# Patient Record
Sex: Female | Born: 1975 | Race: Black or African American | Hispanic: No | Marital: Single
Health system: Southern US, Community
[De-identification: ages and names within clinical notes are randomized; demographics above are authoritative.]

---

## 2020-03-16 ENCOUNTER — Emergency Department (HOSPITAL_COMMUNITY): Payer: Medicaid Other

## 2020-03-16 ENCOUNTER — Encounter (HOSPITAL_COMMUNITY): Payer: Self-pay | Admitting: Emergency Medicine

## 2020-03-16 ENCOUNTER — Emergency Department (HOSPITAL_COMMUNITY)
Admission: EM | Admit: 2020-03-16 | Discharge: 2020-03-16 | Disposition: A | Discharge status: Home / Self Care | Payer: Medicaid Other | Attending: Emergency Medicine | Admitting: Emergency Medicine

## 2020-03-16 ENCOUNTER — Other Ambulatory Visit: Payer: Self-pay

## 2020-03-16 DIAGNOSIS — R52 Pain, unspecified: Secondary | ICD-10-CM

## 2020-03-16 DIAGNOSIS — E119 Type 2 diabetes mellitus without complications: Secondary | ICD-10-CM | POA: Diagnosis not present

## 2020-03-16 DIAGNOSIS — W109XXA Fall (on) (from) unspecified stairs and steps, initial encounter: Secondary | ICD-10-CM | POA: Insufficient documentation

## 2020-03-16 DIAGNOSIS — S93421A Sprain of deltoid ligament of right ankle, initial encounter: Secondary | ICD-10-CM | POA: Insufficient documentation

## 2020-03-16 DIAGNOSIS — Y929 Unspecified place or not applicable: Secondary | ICD-10-CM | POA: Insufficient documentation

## 2020-03-16 DIAGNOSIS — Y939 Activity, unspecified: Secondary | ICD-10-CM | POA: Insufficient documentation

## 2020-03-16 DIAGNOSIS — Y999 Unspecified external cause status: Secondary | ICD-10-CM | POA: Insufficient documentation

## 2020-03-16 DIAGNOSIS — S99911A Unspecified injury of right ankle, initial encounter: Secondary | ICD-10-CM | POA: Diagnosis present

## 2020-03-16 NOTE — Progress Notes (Signed)
Orthopedic Tech Progress Note Patient Details:  Cristina Reyes 01/17/1976 [id number]  Ortho Devices Type of Ortho Device: Crutches, Post (short leg) splint Ortho Device/Splint Location: rle Ortho Device/Splint Interventions: Ordered, Application, Adjustment   Post Interventions Patient Tolerated: Well Instructions Provided: Care of device, Adjustment of device   Trinna Post 03/16/2020, 10:24 PM

## 2020-03-16 NOTE — ED Provider Notes (Addendum)
[facility] EMERGENCY DEPARTMENT Provider Note   CSN: [id number] Arrival date & time: 03/16/20  1941     History Chief Complaint  Patient presents with  . Ankle Injury    Momoka Stringfield is a 44 y.o. female.  The history is provided by the patient.  Ankle Injury   Anyi Fels is a 44 y.o. female who presents to the Emergency Department complaining of ankle injury. She presents the emergency department for evaluation of right ankle injury after she missed her step and fell down to stairs. She feels like she rolled her ankle. She does have a remote history of injury to that ankle but did not require surgery. She has pain with weight-bearing and range of motion. She has a history of type II diabetes. Symptoms are moderate and constant nature.    History reviewed. No pertinent past medical history.  There are no problems to display for this patient.   History reviewed. No pertinent surgical history.   OB History   No obstetric history on file.     No family history on file.  Social History   Tobacco Use  . Smoking status: Not on file  Substance Use Topics  . Alcohol use: Not on file  . Drug use: Not on file    Home Medications Prior to Admission medications   Not on File    Allergies    Patient has no known allergies.  Review of Systems   Review of Systems  All other systems reviewed and are negative.   Physical Exam Updated Vital Signs BP (!) 130/107 (BP Location: Right Arm)   Pulse 96   Temp 98.7 F (37.1 C) (Oral)   Resp 18   Ht 5\' 9"  (1.753 m)   Wt 87.1 kg   SpO2 96%   BMI 28.35 kg/m   Physical Exam Vitals and nursing note reviewed.  Constitutional:      Appearance: She is well-developed.  HENT:     Head: Normocephalic and atraumatic.  Cardiovascular:     Rate and Rhythm: Normal rate and regular rhythm.  Pulmonary:     Effort: Pulmonary effort is normal. No respiratory distress.  Musculoskeletal:      Comments: 2+ DP pulses bilaterally. There is moderate soft tissue swelling and tenderness to palpation over the right lateral ankle. There is decreased range of motion in the right ankle secondary to pain. There is no tenderness or swelling over the right knee.  Skin:    General: Skin is warm and dry.  Neurological:     Mental Status: She is alert and oriented to person, place, and time.  Psychiatric:        Behavior: Behavior normal.     ED Results / Procedures / Treatments   Labs (all labs ordered are listed, but only abnormal results are displayed) Labs Reviewed - No data to display  EKG None  Radiology DG Ankle Complete Right  Result Date: 03/16/2020 CLINICAL DATA:  Pain EXAM: RIGHT ANKLE - COMPLETE 3+ VIEW COMPARISON:  None. FINDINGS: There is extensive soft tissue swelling about the lateral malleolus. There is no definite acute displaced fracture or dislocation. There are well corticated osseous fragments adjacent to the distal lateral malleolus which are likely related to an old remote injury. There are no significant degenerative changes of the ankle mortise. An ankle joint effusion is present. IMPRESSION: 1. Extensive soft tissue swelling about the lateral malleolus without evidence for an acute displaced fracture or dislocation.  2. Ankle joint effusion. 3. Probable old remote injury to the lateral malleolus. Electronically Signed   By: Katherine Mantle M.D.   On: 03/16/2020 20:43    Procedures Procedures (including critical care time) SPLINT APPLICATION Date/Time: 10:23 PM Authorized by: Tilden Fossa Consent: Verbal consent obtained. Risks and benefits: risks, benefits and alternatives were discussed Consent given by: patient Splint applied by: orthopedic technician Location details: RLE Splint type: posterior Supplies used: orthoglass, ace wrap Post-procedure: The splinted body part was neurovascularly unchanged following the procedure. Patient tolerance: Patient  tolerated the procedure well with no immediate complications.   Medications Ordered in ED Medications - No data to display  ED Course  I have reviewed the triage vital signs and the nursing notes.  Pertinent labs & imaging results that were available during my care of the patient were reviewed by me and considered in my medical decision making (see chart for details).    MDM Rules/Calculators/A&P                         patient here for evaluation of right ankle pain after twisting it prior to ED arrival. Imaging is negative for acute fracture, personally reviewed. There is a ankle effusion. Given effusion, concern for ligamentous injury will place and splint. Discussed nonweightbearing status with orthopedics follow-up. Discussed ibuprofen and Tylenol available over-the-counter according to label instructions as needed for pain.  Final Clinical Impression(s) / ED Diagnoses Final diagnoses:  Sprain of deltoid ligament of right ankle, initial encounter    Rx / DC Orders ED Discharge Orders    None       Tilden Fossa, MD 03/16/20 [id number]    Tilden Fossa, MD 03/16/20 2223

## 2020-03-16 NOTE — ED Triage Notes (Signed)
POV, pt tripped down step tonight, right ankle is swollen and very painful.  Unable to put weight on it.

## 2021-10-16 IMAGING — CR DG ANKLE COMPLETE 3+V*R*
3 series · 3 of 3 positions shown · non-contrast
Comparison: None.

CLINICAL DATA: Pain

EXAM:
RIGHT ANKLE - COMPLETE 3+ VIEW

[ankle ap]
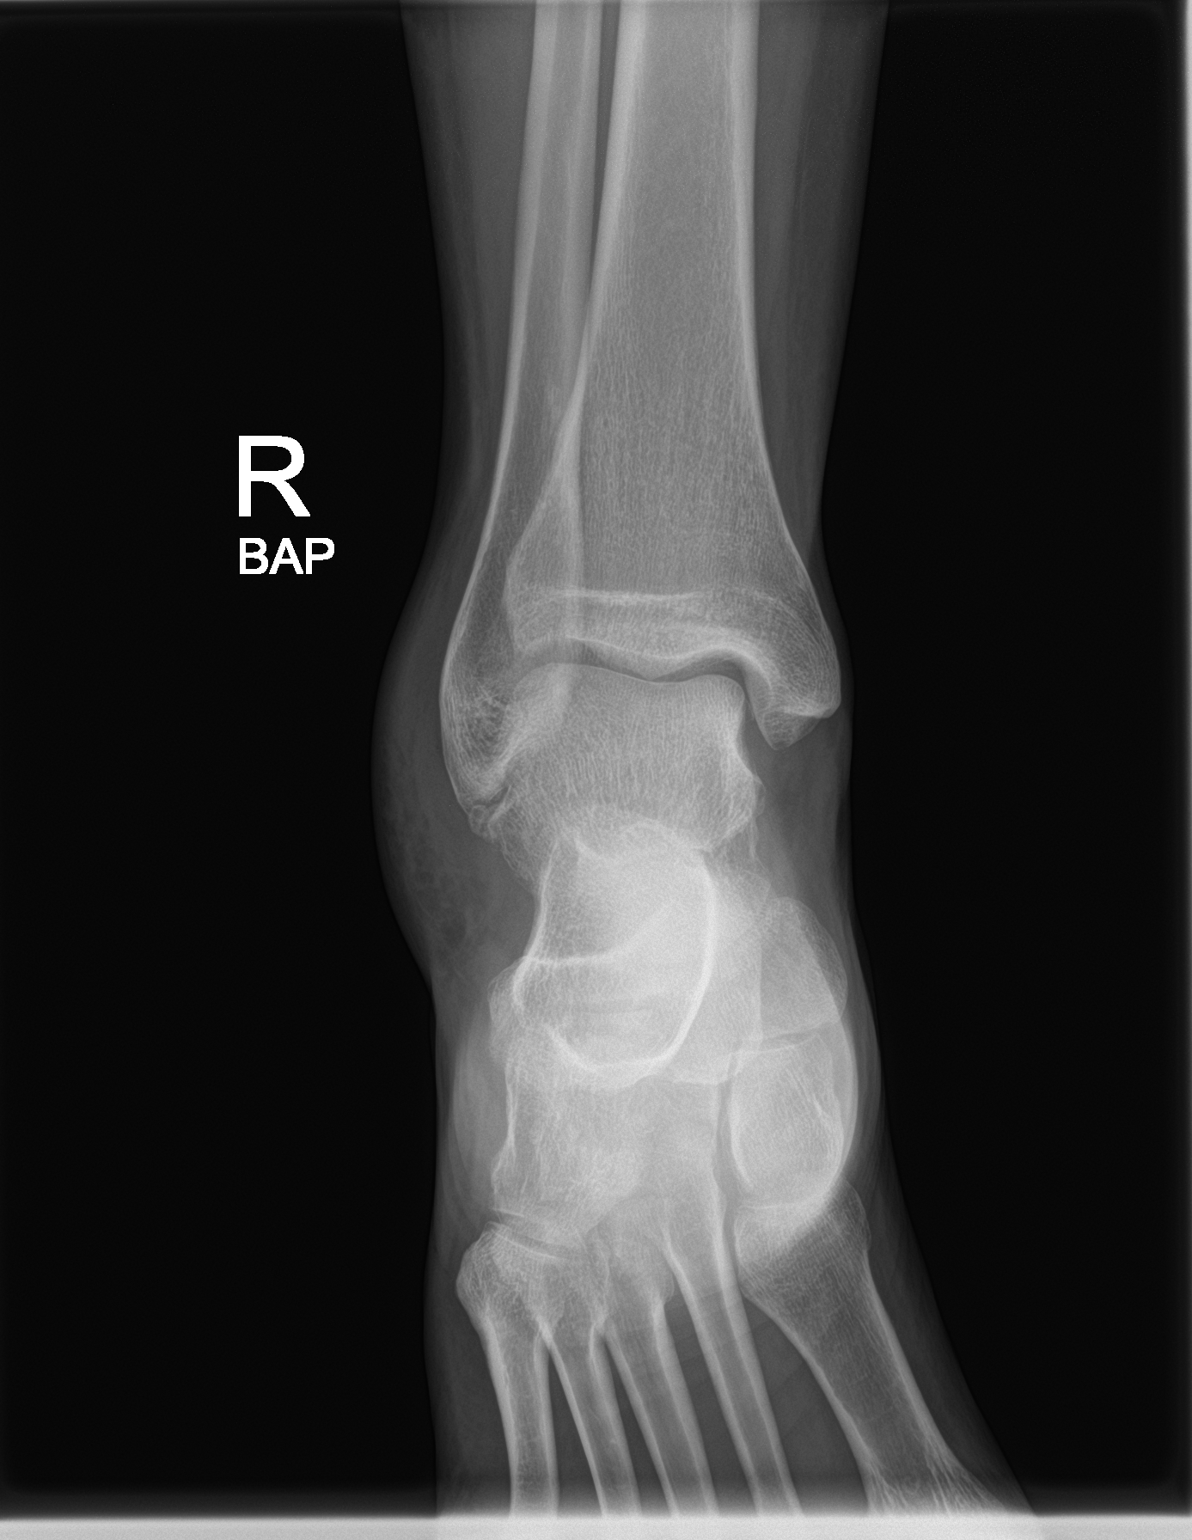

[ankle obl]
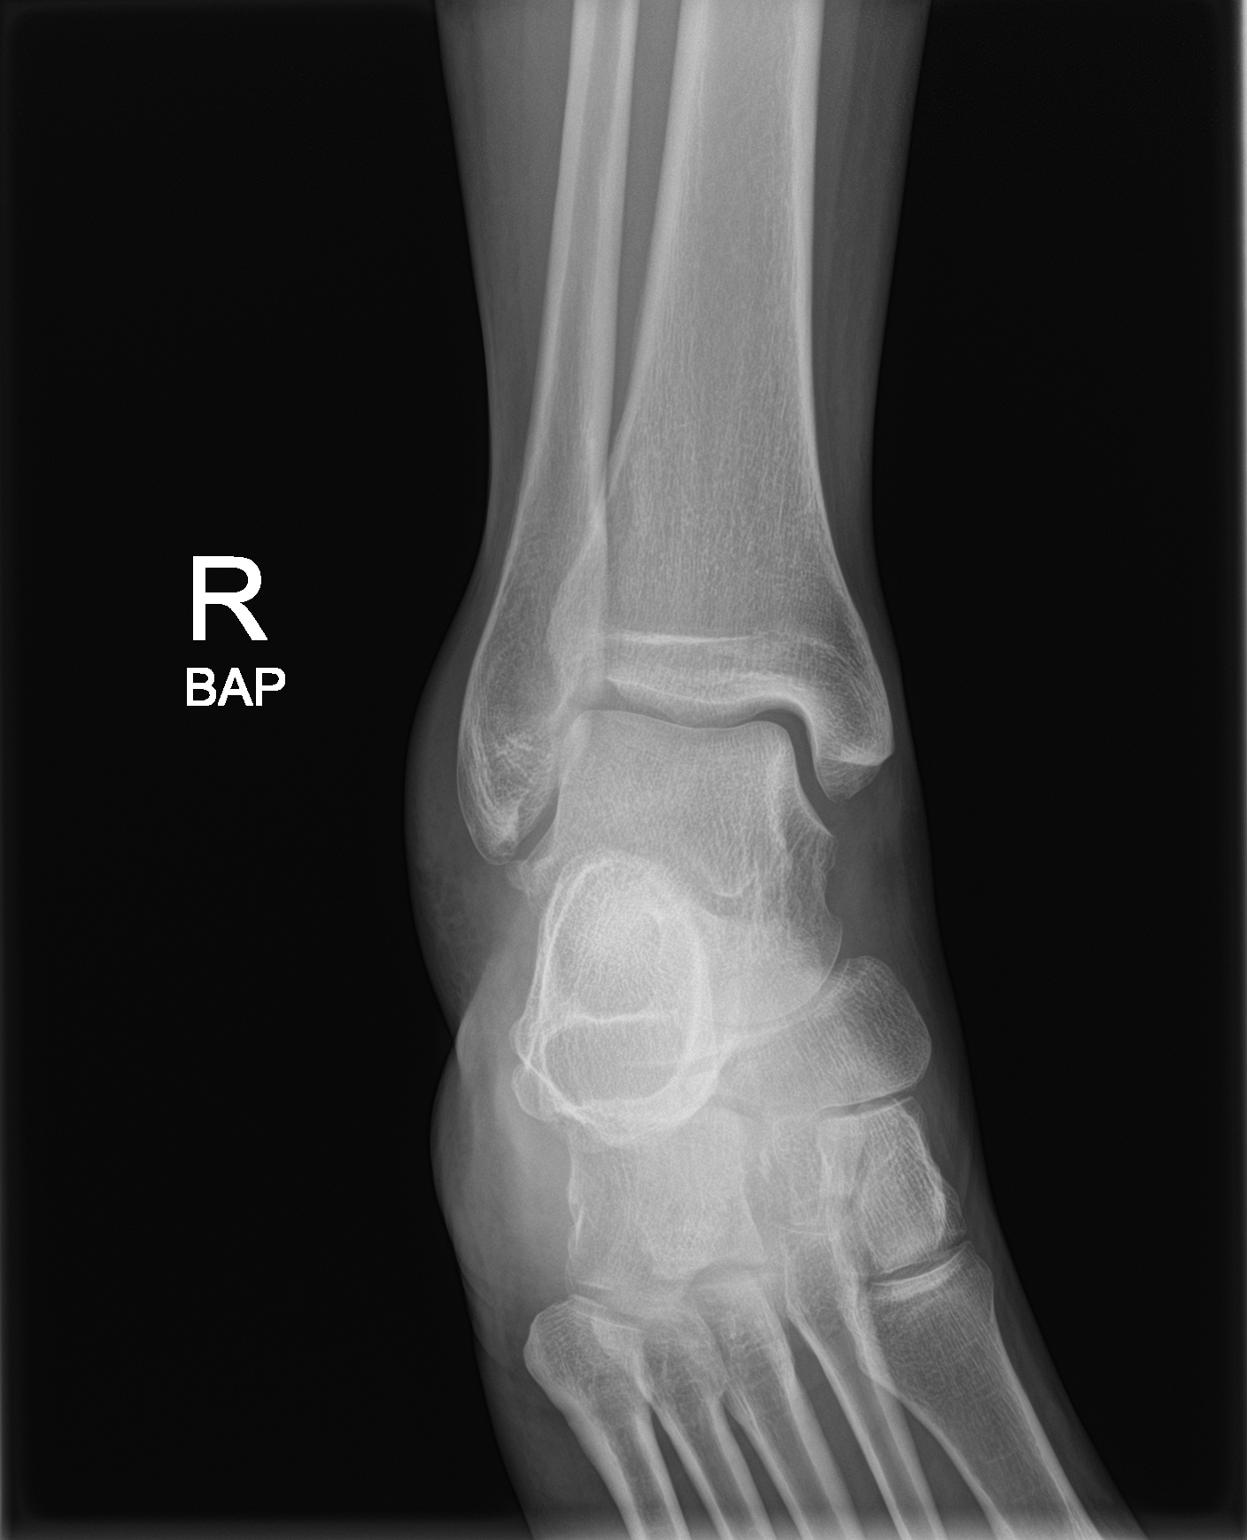

[ankle lat]
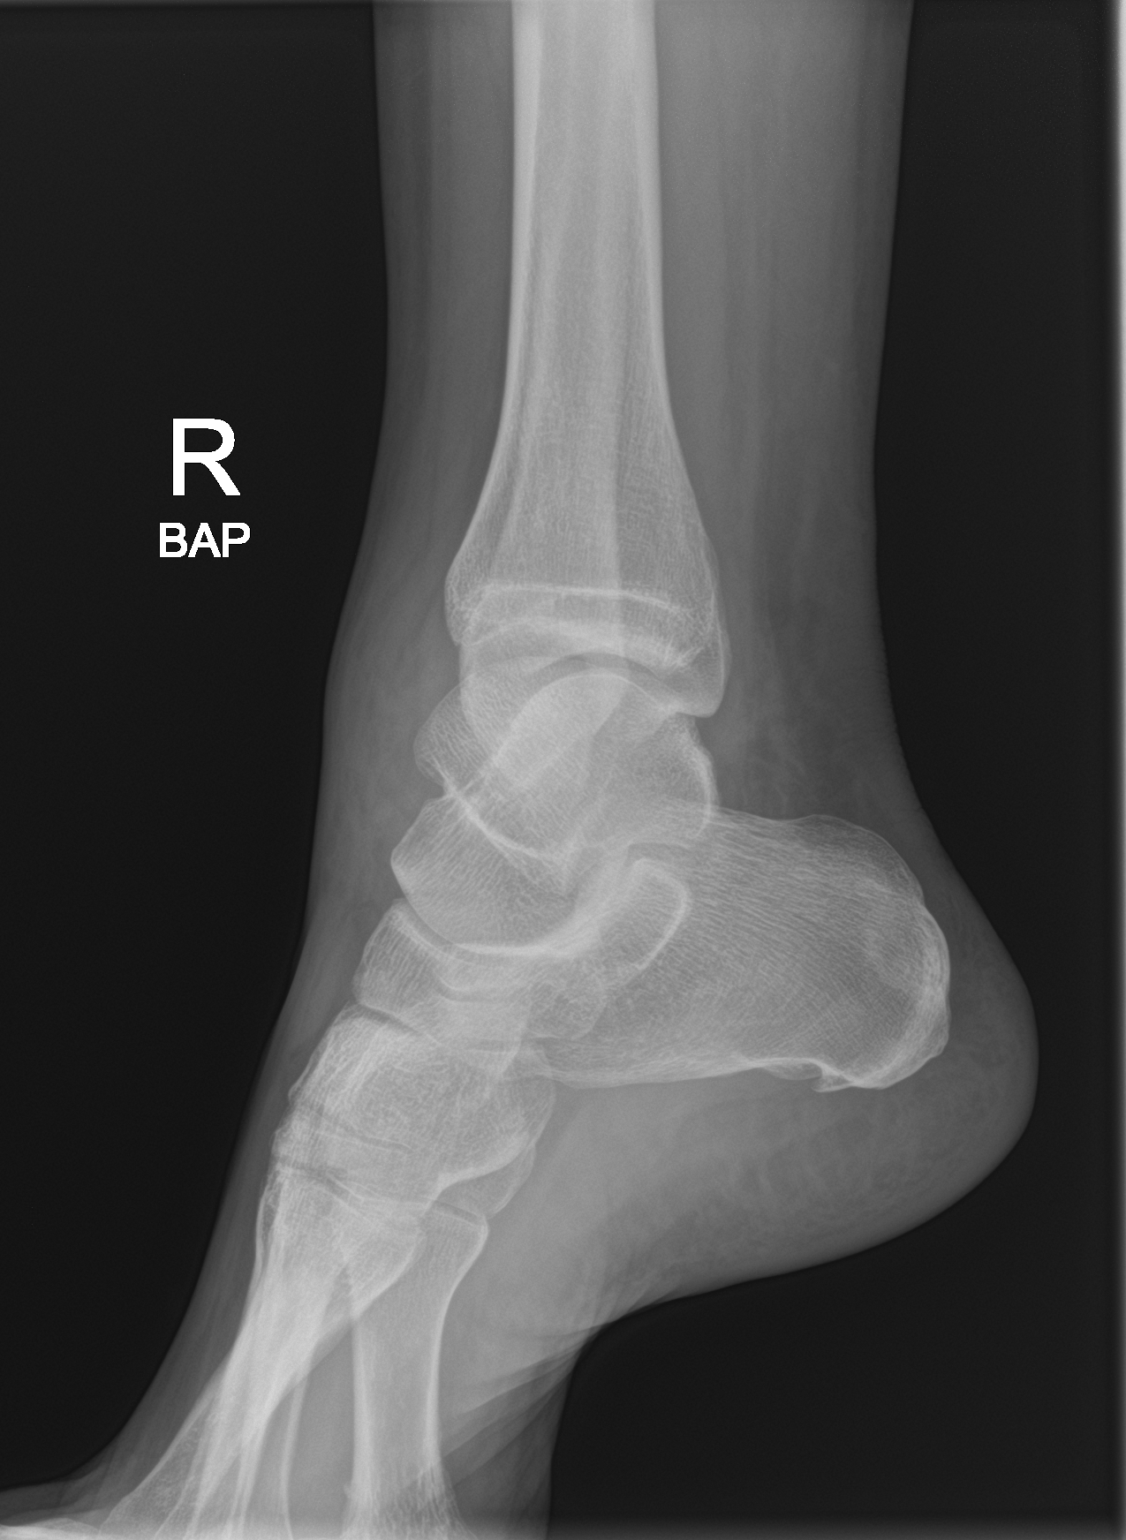

[3 of 3 positions shown; findings below may reference images not displayed]

FINDINGS: There is extensive soft tissue swelling about the lateral malleolus.
There is no definite acute displaced fracture or dislocation. There
are well corticated osseous fragments adjacent to the distal lateral
malleolus which are likely related to an old remote injury. There
are no significant degenerative changes of the ankle mortise. An
ankle joint effusion is present.
IMPRESSION: 1. Extensive soft tissue swelling about the lateral malleolus
without evidence for an acute displaced fracture or dislocation.
2. Ankle joint effusion.
3. Probable old remote injury to the lateral malleolus.
